# Patient Record
Sex: Male | Born: 1975 | Race: White | Marital: Married
Health system: Southern US, Community
[De-identification: ages and names within clinical notes are randomized; demographics above are authoritative.]

---

## 2018-09-03 ENCOUNTER — Other Ambulatory Visit: Payer: Self-pay | Admitting: Neurosurgery

## 2018-09-03 DIAGNOSIS — M5416 Radiculopathy, lumbar region: Secondary | ICD-10-CM

## 2018-09-08 ENCOUNTER — Ambulatory Visit
Admission: RE | Admit: 2018-09-08 | Discharge: 2018-09-08 | Disposition: A | Discharge status: Home / Self Care | Payer: BLUE CROSS/BLUE SHIELD | Source: Ambulatory Visit | Attending: Neurosurgery | Admitting: Neurosurgery

## 2018-09-08 DIAGNOSIS — M5416 Radiculopathy, lumbar region: Secondary | ICD-10-CM

## 2018-09-08 MED ORDER — IOPAMIDOL (ISOVUE-M 200) INJECTION 41%
1.0000 mL | Freq: Once | INTRAMUSCULAR | Status: AC
  Administered 2018-09-08: 1 mL via EPIDURAL

## 2018-09-08 MED ORDER — METHYLPREDNISOLONE ACETATE 40 MG/ML INJ SUSP (RADIOLOG
120.0000 mg | Freq: Once | INTRAMUSCULAR | Status: AC
  Administered 2018-09-08: 120 mg via EPIDURAL

## 2018-09-08 NOTE — Discharge Instructions (Signed)

## 2018-09-14 ENCOUNTER — Other Ambulatory Visit: Payer: Self-pay

## 2020-05-15 IMAGING — XA Imaging study
2 series · 2 of 2 positions shown · non-contrast
Comparison: none

CLINICAL DATA: Lumbosacral spondylosis without myelopathy. Low back
and left leg pain.

[Series 1: ortho standard · 1 of 1 slices shown (1 of 2)]
[im 1/1]
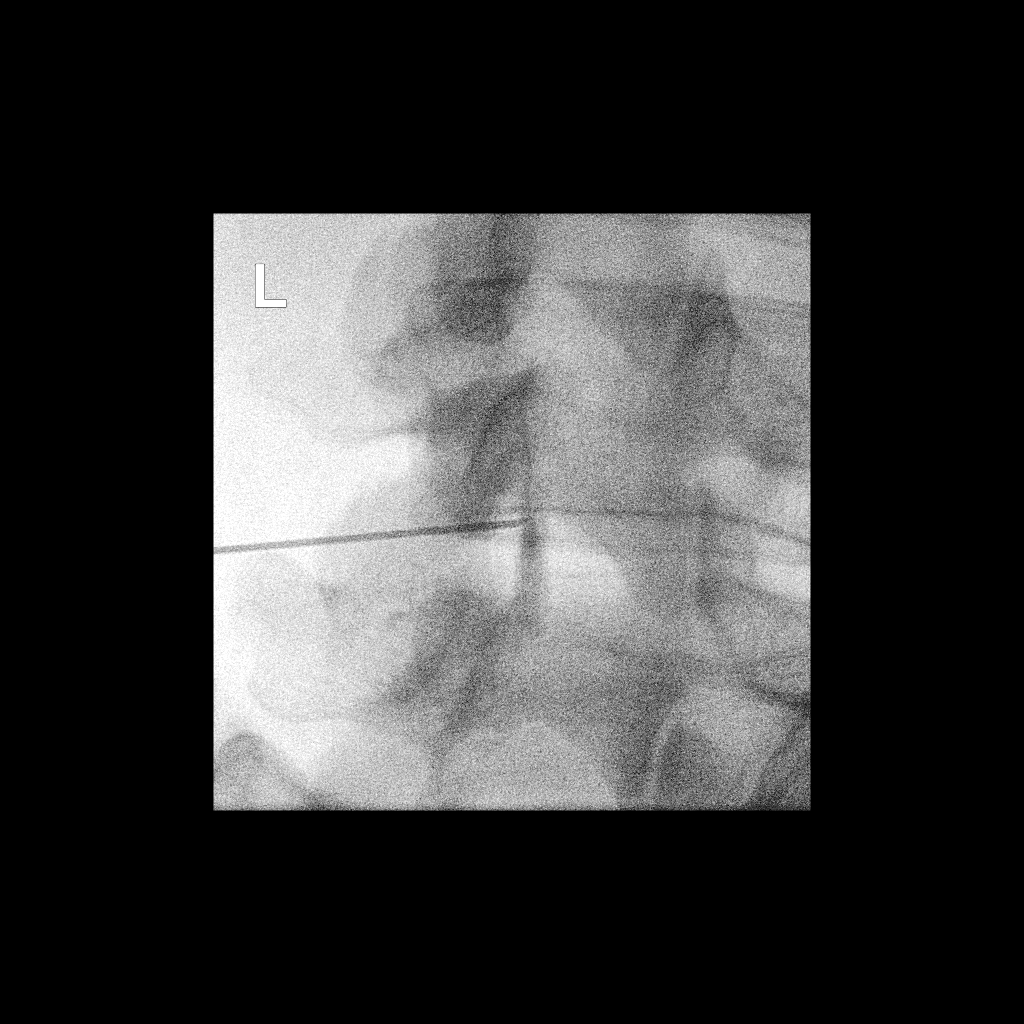

[Series 2: ortho standard · 1 of 1 slices shown (2 of 2)]
[im 1/1]
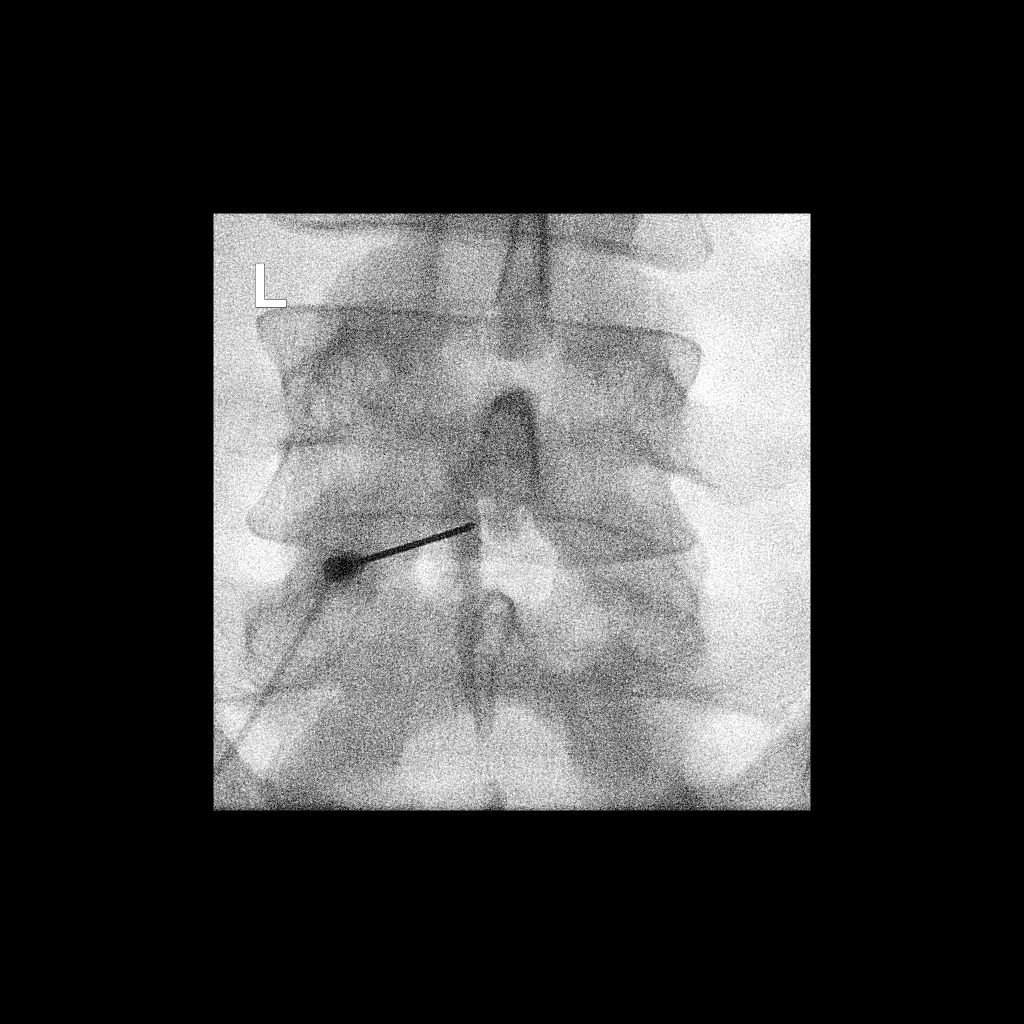

[2 of 2 positions shown; findings below may reference images not displayed]

FLUOROSCOPY TIME:  Radiation Exposure Index (as provided by the
fluoroscopic device): 9.20 microGray*m^2

Fluoroscopy Time (in minutes and seconds):  5 seconds

PROCEDURE:
The procedure, risks, benefits, and alternatives were explained to
the patient. Questions regarding the procedure were encouraged and
answered. The patient understands and consents to the procedure.

LUMBAR EPIDURAL INJECTION:

An interlaminar approach was performed on the left at L4-5. The
overlying skin was cleansed and anesthetized. A 3.5 inch 20 gauge
epidural needle was advanced using loss-of-resistance technique.

DIAGNOSTIC EPIDURAL INJECTION:

Injection of Isovue-M 200 shows a good epidural pattern with spread
above and below the level of needle placement, primarily on the
left. No vascular opacification is seen.

THERAPEUTIC EPIDURAL INJECTION:

120 mg of Depo-Medrol mixed with 3 mL of 1% lidocaine were
instilled. The procedure was well-tolerated, and the patient was
discharged thirty minutes following the injection in good condition.

COMPLICATIONS:
None
IMPRESSION: Technically successful lumbar interlaminar epidural injection on the
left at L4-5.
